# Patient Record
Sex: Female | Born: 1970 | Race: White | Hispanic: No | Marital: Married | Smoking: Never smoker
Health system: Southern US, Community
[De-identification: ages and names within clinical notes are randomized; demographics above are authoritative.]

## PROBLEM LIST (undated history)

## (undated) DIAGNOSIS — T7840XA Allergy, unspecified, initial encounter: Secondary | ICD-10-CM

## (undated) HISTORY — PX: WISDOM TOOTH EXTRACTION: SHX21

## (undated) HISTORY — DX: Allergy, unspecified, initial encounter: T78.40XA

---

## 2003-04-09 ENCOUNTER — Emergency Department (HOSPITAL_COMMUNITY): Admission: EM | Admit: 2003-04-09 | Discharge: 2003-04-09 | Payer: Self-pay

## 2004-07-19 ENCOUNTER — Ambulatory Visit: Payer: Self-pay | Admitting: Internal Medicine

## 2005-10-16 ENCOUNTER — Ambulatory Visit: Payer: Self-pay | Admitting: Internal Medicine

## 2006-10-30 ENCOUNTER — Ambulatory Visit: Payer: Self-pay | Admitting: Obstetrics and Gynecology

## 2008-09-15 ENCOUNTER — Ambulatory Visit: Payer: Self-pay | Admitting: Internal Medicine

## 2008-09-16 ENCOUNTER — Encounter: Payer: Self-pay | Admitting: Internal Medicine

## 2008-09-19 ENCOUNTER — Encounter (INDEPENDENT_AMBULATORY_CARE_PROVIDER_SITE_OTHER): Payer: Self-pay | Admitting: *Deleted

## 2008-09-26 ENCOUNTER — Encounter (INDEPENDENT_AMBULATORY_CARE_PROVIDER_SITE_OTHER): Payer: Self-pay | Admitting: *Deleted

## 2009-08-12 ENCOUNTER — Ambulatory Visit: Payer: Self-pay | Admitting: Family Medicine

## 2009-08-12 LAB — CONVERTED CEMR LAB
Ketones, urine, test strip: NEGATIVE
Nitrite: NEGATIVE
Urobilinogen, UA: 0.2

## 2009-11-09 ENCOUNTER — Ambulatory Visit: Payer: Self-pay | Admitting: Internal Medicine

## 2010-04-09 ENCOUNTER — Ambulatory Visit: Payer: Self-pay | Admitting: Internal Medicine

## 2010-04-17 ENCOUNTER — Encounter: Payer: Self-pay | Admitting: Internal Medicine

## 2010-10-07 LAB — CONVERTED CEMR LAB
AST: 23 units/L (ref 0–37)
Alkaline Phosphatase: 40 units/L (ref 39–117)
Basophils Absolute: 0 10*3/uL (ref 0.0–0.1)
Chloride: 105 meq/L (ref 96–112)
Cholesterol: 194 mg/dL (ref 0–200)
Eosinophils Absolute: 0.1 10*3/uL (ref 0.0–0.7)
GFR calc non Af Amer: 86 mL/min
HDL: 67.4 mg/dL (ref >39.0)
MCHC: 35 g/dL (ref 30.0–36.0)
MCV: 96.1 fL (ref 78.0–100.0)
Neutrophils Relative %: 61.3 % (ref 43.0–77.0)
Platelets: 266 10*3/uL (ref 150–400)
Potassium: 3.7 meq/L (ref 3.5–5.1)
Sodium: 139 meq/L (ref 135–145)
Total Bilirubin: 0.7 mg/dL (ref 0.3–1.2)
VLDL: 14 mg/dL (ref 0–40)
WBC: 7 10*3/uL (ref 4.5–10.5)

## 2010-10-09 NOTE — Assessment & Plan Note (Signed)
Summary: sore throat//fever//lch   Vital Signs:  Patient profile:   40 year old female Weight:      140.2 pounds Temp:     99.4 degrees F oral Pulse rate:   72 / minute Resp:     14 per minute BP sitting:   100 / 62  (left arm) Cuff size:   large  Vitals Entered By: Shonna Chock (November 09, 2009 2:51 PM) CC: Sore Throat and fever x 24 hours. Patient also with headache.  Comments REVIEWED MED LIST, PATIENT AGREED DOSE AND INSTRUCTION CORRECT    CC:  Sore Throat and fever x 24 hours. Patient also with headache. .  History of Present Illness: ST X 24 hrs with temp to 100.6; Rx: NSAIDS with benefit. No nasal symptoms , myalgias or arthralgias. Flu shot 06/2009.  Allergies (verified): No Known Drug Allergies  Review of Systems General:  Complains of chills and fever; denies sweats. Eyes:  Denies discharge and eye pain. Nole:  Denies nasal congestion, postnasal drainage, and sinus pressure; Frontal headache; no facial pain or purulence. Resp:  Complains of cough; denies shortness of breath, sputum productive, and wheezing.  Physical Exam  General:  Appears tired but in no acute distress; alert,appropriate and cooperative throughout examination Eyes:  No corneal or conjunctival inflammation noted. Perrla.  Ears:  External ear exam shows no significant lesions or deformities.  Otoscopic examination reveals clear canals, tympanic membranes are intact bilaterally without bulging, retraction, inflammation or discharge. Hearing is grossly normal bilaterally. Nose:  External nasal examination shows no deformity or inflammation. Nasal mucosa are pink and moist without lesions or exudates. Mouth:  Oral mucosa and oropharynx without lesions or exudates.  Teeth in good repair. Minimal pharyngeal erythema.   Lungs:  Normal respiratory effort, chest expands symmetrically. Lungs are clear to auscultation, no crackles or wheezes. Heart:  Normal rate and regular rhythm. S1 and S2 normal without  gallop, murmur, click, rub or other extra sounds. Cervical Nodes:  No lymphadenopathy noted Axillary Nodes:  No palpable lymphadenopathy   Impression & Recommendations:  Problem # 1:  PHARYNGITIS-ACUTE (ICD-462)  Her updated medication list for this problem includes:    Azithromycin 250 Mg Tabs (Azithromycin) .Marland Kitchen... As per pack  Problem # 2:  COUGH (ICD-786.2)  Complete Medication List: 1)  Low-ogestrel 0.3-30 Mg-mcg Tabs (Norgestrel-ethinyl estradiol) .... As directed 2)  Restasis 0.05 % Emul (Cyclosporine) .Marland Kitchen.. 1 drop in each eye two times a day 3)  Azithromycin 250 Mg Tabs (Azithromycin) .... As per pack  Other Orders: Rapid Strep (52841)  Patient Instructions: 1)  Drink as much fluid as you can tolerate for the next few days. 2)  Take 650-1000mg  of Tylenol every 4-6 hours as needed for relief of pain or comfort of fever AVOID taking more than 4000mg   in a 24 hour period (can cause liver damage in higher doses)OR take 400-600mg  of Ibuprofen (Advil, Motrin) with food every 4-6 hours as needed for relief of pain or comfort of fever. Prescriptions: AZITHROMYCIN 250 MG TABS (AZITHROMYCIN) as per pack  #1 x 0   Entered and Authorized by:   Marga Melnick MD   Signed by:   Marga Melnick MD on 11/09/2009   Method used:   Faxed to ...       [facility] [phone]* (retail)       [address]       [place], [place]  [address]  Ph: [phone]       Fax: [phone]   RxID:   [id number]

## 2010-10-09 NOTE — Assessment & Plan Note (Signed)
Summary: CPX AND FASTING LABS///SP[facility]   Vital Signs:  Patient profile:   40 year old female Height:      68 inches Weight:      140.4 pounds BMI:     21.42 Temp:     98.5 degrees F oral Pulse rate:   62 / minute Resp:     14 per minute BP sitting:   100 / 62  (left arm) Cuff size:   large  Vitals Entered By: Shonna Chock CMA (April 09, 2010 9:51 AM)    History of Present Illness: Maria Valenzuela is here for an  physical; she is asymptomatic .  Current Medications (verified): 1)  Low-Ogestrel 0.3-30 Mg-Mcg Tabs (Norgestrel-Ethinyl Estradiol) .... As Directed 2)  Restasis 0.05 % Emul (Cyclosporine) .Marland Kitchen.. 1 Drop in Each Eye Two Times A Day  Allergies (verified): No Known Drug Allergies  Past History:  Past Medical History: Calcium  oxalate crystals in urine, PMH of ( no PMH  of  renal calculi)  Past Surgical History: Denies surgical history except Wisdom Teeth extraction unilaterally  Family History: P aunt : stomach cancer Father: DVT of UE, alcoholism Mother: MI @  60 Siblings: negative; PGF: lung  cancer; CAD in both sides of family; PGM :DM;P aunt: MI in her 75s  Social History: Occupation: Optician, dispensing Married Never Smoked Alcohol use-yes: socially Regular exercise-yes: 2-3X/week medium - high intensity  Review of Systems  The patient denies anorexia, fever, weight loss, weight gain, vision loss, decreased hearing, hoarseness, chest pain, syncope, dyspnea on exertion, peripheral edema, prolonged cough, headaches, hemoptysis, abdominal pain, melena, hematochezia, severe indigestion/heartburn, hematuria, suspicious skin lesions, depression, unusual weight change, abnormal bleeding, enlarged lymph nodes, and angioedema.         Lightheadedness with pulse > 160  @  high level of exercise   Physical Exam  General:  Thin but well-nourished; alert,appropriate and cooperative throughout examination Head:  Normocephalic and atraumatic without obvious abnormalities.  Eyes:   No corneal or conjunctival inflammation noted.Perrla. Funduscopic exam benign, without hemorrhages, exudates or papilledema.  Ears:  External ear exam shows no significant lesions or deformities.  Otoscopic examination reveals clear canals, tympanic membranes are intact bilaterally without bulging, retraction, inflammation or discharge. Hearing is grossly normal bilaterally. Nose:  External nasal examination shows no deformity or inflammation. Nasal mucosa are pink and moist without lesions or exudates. Mouth:  Oral mucosa and oropharynx without lesions or exudates.  Teeth in good repair. Neck:  No deformities, masses, or tenderness noted. thyroid small Lungs:  Normal respiratory effort, chest expands symmetrically. Lungs are clear to auscultation, no crackles or wheezes. Heart:  Normal rate and regular rhythm. S1 and S2 normal without gallop, murmur, click, rub. Soft S4 Abdomen:  Bowel sounds positive,abdomen soft and non-tender without masses, organomegaly or hernias noted. Msk:  No deformity or scoliosis noted of thoracic or lumbar spine.   Pulses:  R and L carotid,radial,dorsalis pedis and posterior tibial pulses are full and equal bilaterally Extremities:  No clubbing, cyanosis, edema, or deformity noted with normal full range of motion of all joints.   Neurologic:  alert & oriented X3 and DTRs symmetrical and normal.   Skin:  Intact without suspicious lesions or rashes Cervical Nodes:  No lymphadenopathy noted Axillary Nodes:  No palpable lymphadenopathy Psych:  memory intact for recent and remote, normally interactive, and good eye contact.     Impression & Recommendations:  Problem # 1:  ROUTINE GENERAL MEDICAL EXAM@HEALTH  CARE FACL (ICD-V70.0)  Orders:  EKG w/ Interpretation (93000)  Complete Medication List: 1)  Low-ogestrel 0.3-30 Mg-mcg Tabs (Norgestrel-ethinyl estradiol) .... As directed 2)  Restasis 0.05 % Emul (Cyclosporine) .Marland Kitchen.. 1 drop in each eye two times a day  Patient  Instructions: 1)  It is important that you exercise regularly at least 20 minutes 5 times a week. Recommended is  an optimal program of 30-45 min 3-4X/ week. If you develop chest pain, have severe difficulty breathing, or feel very tired , stop exercising immediately and seek medical attention. Keep maximal heart rate to < 130.Consider fasting labs: 2)  BMP ; 3)  Hepatic Panel ; 4)  Lipid Panel ; 5)  TSH ; 6)  CBC w/ Diff .

## 2012-01-29 ENCOUNTER — Ambulatory Visit: Payer: Self-pay | Admitting: Obstetrics and Gynecology

## 2013-03-03 ENCOUNTER — Ambulatory Visit: Payer: Self-pay | Admitting: Obstetrics and Gynecology

## 2014-05-02 LAB — HM PAP SMEAR

## 2014-06-02 ENCOUNTER — Ambulatory Visit: Payer: Self-pay | Admitting: Obstetrics and Gynecology

## 2015-05-18 ENCOUNTER — Other Ambulatory Visit: Payer: Self-pay | Admitting: Obstetrics and Gynecology

## 2015-05-18 DIAGNOSIS — Z1231 Encounter for screening mammogram for malignant neoplasm of breast: Secondary | ICD-10-CM

## 2015-05-18 LAB — FECAL OCCULT BLOOD, GUAIAC: Fecal Occult Blood: NEGATIVE

## 2015-06-06 ENCOUNTER — Ambulatory Visit: Payer: Self-pay

## 2015-06-14 ENCOUNTER — Ambulatory Visit
Admission: RE | Admit: 2015-06-14 | Discharge: 2015-06-14 | Disposition: A | Discharge status: Home / Self Care | Payer: 59 | Source: Ambulatory Visit | Attending: Obstetrics and Gynecology | Admitting: Obstetrics and Gynecology

## 2015-06-14 DIAGNOSIS — Z1231 Encounter for screening mammogram for malignant neoplasm of breast: Secondary | ICD-10-CM | POA: Insufficient documentation

## 2015-06-14 LAB — HM MAMMOGRAPHY

## 2016-01-04 ENCOUNTER — Encounter: Payer: Self-pay | Admitting: Internal Medicine

## 2016-01-04 ENCOUNTER — Ambulatory Visit (INDEPENDENT_AMBULATORY_CARE_PROVIDER_SITE_OTHER): Payer: 59 | Admitting: Internal Medicine

## 2016-01-04 VITALS — BP 98/62 | HR 64 | Temp 98.4°F | Ht 67.0 in | Wt 138.0 lb

## 2016-01-04 DIAGNOSIS — Z0000 Encounter for general adult medical examination without abnormal findings: Secondary | ICD-10-CM | POA: Diagnosis not present

## 2016-01-04 DIAGNOSIS — J302 Other seasonal allergic rhinitis: Secondary | ICD-10-CM | POA: Diagnosis not present

## 2016-01-04 NOTE — Patient Instructions (Signed)
Health Maintenance, Female Adopting a healthy lifestyle and getting preventive care can go a long way to promote health and wellness. Talk with your health care provider about what schedule of regular examinations is right for you. This is a good chance for you to check in with your provider about disease prevention and staying healthy. In between checkups, there are plenty of things you can do on your own. Experts have done a lot of research about which lifestyle changes and preventive measures are most likely to keep you healthy. Ask your health care provider for more information. WEIGHT AND DIET  Eat a healthy diet  Be sure to include plenty of vegetables, fruits, low-fat dairy products, and lean protein.  Do not eat a lot of foods high in solid fats, added sugars, or salt.  Get regular exercise. This is one of the most important things you can do for your health.  Most adults should exercise for at least 150 minutes each week. The exercise should increase your heart rate and make you sweat (moderate-intensity exercise).  Most adults should also do strengthening exercises at least twice a week. This is in addition to the moderate-intensity exercise.  Maintain a healthy weight  Body mass index (BMI) is a measurement that can be used to identify possible weight problems. It estimates body fat based on height and weight. Your health care provider can help determine your BMI and help you achieve or maintain a healthy weight.  For females 20 years of age and older:   A BMI below 18.5 is considered underweight.  A BMI of 18.5 to 24.9 is normal.  A BMI of 25 to 29.9 is considered overweight.  A BMI of 30 and above is considered obese.  Watch levels of cholesterol and blood lipids  You should start having your blood tested for lipids and cholesterol at 45 years of age, then have this test every 5 years.  You may need to have your cholesterol levels checked more often if:  Your lipid  or cholesterol levels are high.  You are older than 45 years of age.  You are at high risk for heart disease.  CANCER SCREENING   Lung Cancer  Lung cancer screening is recommended for adults 55-80 years old who are at high risk for lung cancer because of a history of smoking.  A yearly low-dose CT scan of the lungs is recommended for people who:  Currently smoke.  Have quit within the past 15 years.  Have at least a 30-pack-year history of smoking. A pack year is smoking an average of one pack of cigarettes a day for 1 year.  Yearly screening should continue until it has been 15 years since you quit.  Yearly screening should stop if you develop a health problem that would prevent you from having lung cancer treatment.  Breast Cancer  Practice breast self-awareness. This means understanding how your breasts normally appear and feel.  It also means doing regular breast self-exams. Let your health care provider know about any changes, no matter how small.  If you are in your 20s or 30s, you should have a clinical breast exam (CBE) by a health care provider every 1-3 years as part of a regular health exam.  If you are 40 or older, have a CBE every year. Also consider having a breast X-ray (mammogram) every year.  If you have a family history of breast cancer, talk to your health care provider about genetic screening.  If you   are at high risk for breast cancer, talk to your health care provider about having an MRI and a mammogram every year.  Breast cancer gene (BRCA) assessment is recommended for women who have family members with BRCA-related cancers. BRCA-related cancers include:  Breast.  Ovarian.  Tubal.  Peritoneal cancers.  Results of the assessment will determine the need for genetic counseling and BRCA1 and BRCA2 testing. Cervical Cancer Your health care provider may recommend that you be screened regularly for cancer of the pelvic organs (ovaries, uterus, and  vagina). This screening involves a pelvic examination, including checking for microscopic changes to the surface of your cervix (Pap test). You may be encouraged to have this screening done every 3 years, beginning at age 21.  For women ages 30-65, health care providers may recommend pelvic exams and Pap testing every 3 years, or they may recommend the Pap and pelvic exam, combined with testing for human papilloma virus (HPV), every 5 years. Some types of HPV increase your risk of cervical cancer. Testing for HPV may also be done on women of any age with unclear Pap test results.  Other health care providers may not recommend any screening for nonpregnant women who are considered low risk for pelvic cancer and who do not have symptoms. Ask your health care provider if a screening pelvic exam is right for you.  If you have had past treatment for cervical cancer or a condition that could lead to cancer, you need Pap tests and screening for cancer for at least 20 years after your treatment. If Pap tests have been discontinued, your risk factors (such as having a new sexual partner) need to be reassessed to determine if screening should resume. Some women have medical problems that increase the chance of getting cervical cancer. In these cases, your health care provider may recommend more frequent screening and Pap tests. Colorectal Cancer  This type of cancer can be detected and often prevented.  Routine colorectal cancer screening usually begins at 45 years of age and continues through 45 years of age.  Your health care provider may recommend screening at an earlier age if you have risk factors for colon cancer.  Your health care provider may also recommend using home test kits to check for hidden blood in the stool.  A small camera at the end of a tube can be used to examine your colon directly (sigmoidoscopy or colonoscopy). This is done to check for the earliest forms of colorectal  cancer.  Routine screening usually begins at age 50.  Direct examination of the colon should be repeated every 5-10 years through 45 years of age. However, you may need to be screened more often if early forms of precancerous polyps or small growths are found. Skin Cancer  Check your skin from head to toe regularly.  Tell your health care provider about any new moles or changes in moles, especially if there is a change in a mole's shape or color.  Also tell your health care provider if you have a mole that is larger than the size of a pencil eraser.  Always use sunscreen. Apply sunscreen liberally and repeatedly throughout the day.  Protect yourself by wearing long sleeves, pants, a wide-brimmed hat, and sunglasses whenever you are outside. HEART DISEASE, DIABETES, AND HIGH BLOOD PRESSURE   High blood pressure causes heart disease and increases the risk of stroke. High blood pressure is more likely to develop in:  People who have blood pressure in the high end   of the normal range (130-139/85-89 mm Hg).  People who are overweight or obese.  People who are African American.  If you are 38-23 years of age, have your blood pressure checked every 3-5 years. If you are 61 years of age or older, have your blood pressure checked every year. You should have your blood pressure measured twice--once when you are at a hospital or clinic, and once when you are not at a hospital or clinic. Record the average of the two measurements. To check your blood pressure when you are not at a hospital or clinic, you can use:  An automated blood pressure machine at a pharmacy.  A home blood pressure monitor.  If you are between 45 years and 39 years old, ask your health care provider if you should take aspirin to prevent strokes.  Have regular diabetes screenings. This involves taking a blood sample to check your fasting blood sugar level.  If you are at a normal weight and have a low risk for diabetes,  have this test once every three years after 45 years of age.  If you are overweight and have a high risk for diabetes, consider being tested at a younger age or more often. PREVENTING INFECTION  Hepatitis B  If you have a higher risk for hepatitis B, you should be screened for this virus. You are considered at high risk for hepatitis B if:  You were born in a country where hepatitis B is common. Ask your health care provider which countries are considered high risk.  Your parents were born in a high-risk country, and you have not been immunized against hepatitis B (hepatitis B vaccine).  You have HIV or AIDS.  You use needles to inject street drugs.  You live with someone who has hepatitis B.  You have had sex with someone who has hepatitis B.  You get hemodialysis treatment.  You take certain medicines for conditions, including cancer, organ transplantation, and autoimmune conditions. Hepatitis C  Blood testing is recommended for:  Everyone born from [birth year] through 1965.  Anyone with known risk factors for hepatitis C. Sexually transmitted infections (STIs)  You should be screened for sexually transmitted infections (STIs) including gonorrhea and chlamydia if:  You are sexually active and are younger than 45 years of age.  You are older than 45 years of age and your health care provider tells you that you are at risk for this type of infection.  Your sexual activity has changed since you were last screened and you are at an increased risk for chlamydia or gonorrhea. Ask your health care provider if you are at risk.  If you do not have HIV, but are at risk, it may be recommended that you take a prescription medicine daily to prevent HIV infection. This is called pre-exposure prophylaxis (PrEP). You are considered at risk if:  You are sexually active and do not regularly use condoms or know the HIV status of your partner(s).  You take drugs by injection.  You are sexually  active with a partner who has HIV. Talk with your health care provider about whether you are at high risk of being infected with HIV. If you choose to begin PrEP, you should first be tested for HIV. You should then be tested every 3 months for as long as you are taking PrEP.  PREGNANCY   If you are premenopausal and you may become pregnant, ask your health care provider about preconception counseling.  If you may  become pregnant, take 400 to 800 micrograms (mcg) of folic acid every day.  If you want to prevent pregnancy, talk to your health care provider about birth control (contraception). OSTEOPOROSIS AND MENOPAUSE   Osteoporosis is a disease in which the bones lose minerals and strength with aging. This can result in serious bone fractures. Your risk for osteoporosis can be identified using a bone density scan.  If you are 61 years of age or older, or if you are at risk for osteoporosis and fractures, ask your health care provider if you should be screened.  Ask your health care provider whether you should take a calcium or vitamin D supplement to lower your risk for osteoporosis.  Menopause may have certain physical symptoms and risks.  Hormone replacement therapy may reduce some of these symptoms and risks. Talk to your health care provider about whether hormone replacement therapy is right for you.  HOME CARE INSTRUCTIONS   Schedule regular health, dental, and eye exams.  Stay current with your immunizations.   Do not use any tobacco products including cigarettes, chewing tobacco, or electronic cigarettes.  If you are pregnant, do not drink alcohol.  If you are breastfeeding, limit how much and how often you drink alcohol.  Limit alcohol intake to no more than 1 drink per day for nonpregnant women. One drink equals 12 ounces of beer, 5 ounces of wine, or 1 ounces of hard liquor.  Do not use street drugs.  Do not share needles.  Ask your health care provider for help if  you need support or information about quitting drugs.  Tell your health care provider if you often feel depressed.  Tell your health care provider if you have ever been abused or do not feel safe at home.   This information is not intended to replace advice given to you by your health care provider. Make sure you discuss any questions you have with your health care provider.   Document Released: 03/11/2011 Document Revised: 09/16/2014 Document Reviewed: 07/28/2013 Elsevier Interactive Patient Education [employer].

## 2016-01-04 NOTE — Progress Notes (Signed)
HPI  Pt presents to the clinic today to establish care and for management of the conditions listed below. She is transferring care from Dr. Alwyn Ren, but has not seen him in 6 years. She does follow with gyn. She would like to get her annual exam today.  Seasonal Allergies: Worse in the Spring. She takes Flonase OTC with good relief. She reports she occasionally blows clear, blood streaked mucous out of her nose which is a little concerning, but she has not tried anything to treat this.  Flu: 06/2015 Tetanus: < 5 years ago, unsure of exact date Pap Smear: 2015, abnormal per pt Mammogram: 06/2015 at [facility], normal Vision Screening: yearly Dentist: biannually  Diet: She does eat lean meat. She consumes fruits and veggies daily. She tries to avoid fried foods. She drinks mostly water. Exercise: She likes to walk.   Past Medical History  Diagnosis Date  . Allergy     Current Outpatient Prescriptions  Medication Sig Dispense Refill  . CRYSELLE-28 0.3-30 MG-MCG tablet Take 1 tablet by mouth daily.      No current facility-administered medications for this visit.    No Known Allergies  Family History  Problem Relation Age of Onset  . Stomach cancer Paternal Aunt 75  . Diabetes Paternal Aunt   . Lung cancer Paternal Grandfather 45  . Arthritis Mother   . Heart disease Mother   . Alcohol abuse Father   . Heart disease Father     Social History   Social History  . Marital Status: Married    Spouse Name: N/A  . Number of Children: N/A  . Years of Education: N/A   Occupational History  . Not on file.   Social History Main Topics  . Smoking status: Never Smoker   . Smokeless tobacco: Never Used  . Alcohol Use: 0.0 oz/week    0 Standard drinks or equivalent per week     Comment: occasional  . Drug Use: Not on file  . Sexual Activity: Not on file   Other Topics Concern  . Not on file   Social History Narrative    ROS:  Constitutional: Denies fever, malaise,  fatigue, headache or abrupt weight changes.  HEENT: Denies eye pain, eye redness, ear pain, ringing in the ears, wax buildup, runny nose, nasal congestion, or sore throat. Respiratory: Denies difficulty breathing, shortness of breath, cough or sputum production.   Cardiovascular: Denies chest pain, chest tightness, palpitations or swelling in the hands or feet.  Gastrointestinal: Denies abdominal pain, bloating, constipation, diarrhea or blood in the stool.  GU: Denies frequency, urgency, pain with urination, blood in urine, odor or discharge. Musculoskeletal: Denies decrease in range of motion, difficulty with gait, muscle pain or joint pain and swelling.  Skin: Denies redness, rashes, lesions or ulcercations.  Neurological: Denies dizziness, difficulty with memory, difficulty with speech or problems with balance and coordination.  Psych: Denies anxiety, depression, SI/HI.  No other specific complaints in a complete review of systems (except as listed in HPI above).  PE:  BP 98/62 mmHg  Pulse 64  Temp(Src) 98.4 F (36.9 C) (Oral)  Ht  (1.702 m)  Wt 138 lb (62.596 kg)  BMI 21.61 kg/m2  SpO2 99%  LMP 12/08/2015 Wt Readings from Last 3 Encounters:  01/04/16 138 lb (62.596 kg)  04/09/10 140 lb 6.4 oz (63.685 kg)  11/09/09 140 lb 3.2 oz (63.594 kg)    General: Appears her stated age, well developed, well nourished in NAD. HEENT: Head: normal  shape and size; Eyes: sclera white, no icterus, conjunctiva pink, PERRLA and EOMs intact; Ears: Tm's gray and intact, normal light reflex; Throat/Mouth: Teeth present, mucosa pink and moist, no lesions or ulcerations noted.  Neck: Neck supple, trachea midline. No masses, lumps or thyromegaly present.  Cardiovascular: Normal rate and rhythm. S1,S2 noted.  No murmur, rubs or gallops noted. No JVD or BLE edema.  Pulmonary/Chest: Normal effort and positive vesicular breath sounds. No respiratory distress. No wheezes, rales or ronchi noted.   Abdomen: Soft and nontender. Normal bowel sounds. No distention or masses noted. Liver, spleen and kidneys non palpable. Musculoskeletal: Strength 5/5 BUE/BLE. No signs of joint swelling. No difficulty with gait.  Neurological: Alert and oriented. Cranial nerves II-XII grossly intact. Coordination normal.  Psychiatric: Mood and affect normal. Behavior is normal. Judgment and thought content normal.    BMET    Component Value Date/Time   NA 139 09/15/2008 0000   K 3.7 09/15/2008 0000   CL 105 09/15/2008 0000   CO2 26 09/15/2008 0000   GLUCOSE 77 09/15/2008 0000   BUN 9 09/15/2008 0000   CREATININE 0.8 09/15/2008 0000   CALCIUM 9.1 09/15/2008 0000   GFRNONAA 86 09/15/2008 0000   GFRAA 104 09/15/2008 0000    Lipid Panel     Component Value Date/Time   CHOL 194 09/15/2008 0000   TRIG 68 09/15/2008 0000   HDL 67.4 09/15/2008 0000   CHOLHDL 2.9 CALC 09/15/2008 0000   VLDL 14 09/15/2008 0000   LDLCALC 113* 09/15/2008 0000    CBC    Component Value Date/Time   WBC 7.0 09/15/2008 0000   RBC 3.84* 09/15/2008 0000   HGB 12.9 09/15/2008 0000   HCT 36.9 09/15/2008 0000   PLT 266 09/15/2008 0000   MCV 96.1 09/15/2008 0000   MCHC 35.0 09/15/2008 0000   RDW 12.1 09/15/2008 0000   MONOABS 0.6 09/15/2008 0000   EOSABS 0.1 09/15/2008 0000   BASOSABS 0.0 09/15/2008 0000    Hgb A1C No results found for: HGBA1C   Assessment and Plan:  Preventative Health Maintenance:  Flu and Tetanus UTD [facility], she will continue to follow with gyn Mammogram UTD Encouraged her to consume a balanced diet and continue exercise Encouraged her to see an eye doctor and dentist at least annually CBC, CMET, Lipid and HIV today  RTC in 1 year or sooner if needed

## 2016-01-04 NOTE — Progress Notes (Signed)
Pre visit review using our clinic review tool, if applicable. No additional management support is needed unless otherwise documented below in the visit note. 

## 2016-01-04 NOTE — Assessment & Plan Note (Signed)
Continue Flonase daily prn If noticing blood tinged mucous, add in Nasal Saline spray daily prn

## 2016-01-05 LAB — COMPREHENSIVE METABOLIC PANEL
ALBUMIN: 4.4 g/dL (ref 3.5–5.5)
ALK PHOS: 50 IU/L (ref 39–117)
ALT: 16 IU/L (ref 0–32)
AST: 25 IU/L (ref 0–40)
Albumin/Globulin Ratio: 1.4 (ref 1.2–2.2)
BUN / CREAT RATIO: 12 (ref 9–23)
BUN: 12 mg/dL (ref 6–24)
Bilirubin Total: 0.2 mg/dL (ref 0.0–1.2)
CO2: 19 mmol/L (ref 18–29)
CREATININE: 1.01 mg/dL — AB (ref 0.57–1.00)
Calcium: 9.7 mg/dL (ref 8.7–10.2)
Chloride: 101 mmol/L (ref 96–106)
GFR calc Af Amer: 78 mL/min/{1.73_m2} (ref >59)
GFR calc non Af Amer: 68 mL/min/{1.73_m2} (ref >59)
Globulin, Total: 3.2 g/dL (ref 1.5–4.5)
Glucose: 90 mg/dL (ref 65–99)
Potassium: 4.5 mmol/L (ref 3.5–5.2)
Sodium: 141 mmol/L (ref 134–144)
Total Protein: 7.6 g/dL (ref 6.0–8.5)

## 2016-01-05 LAB — LIPID PANEL
CHOLESTEROL TOTAL: 244 mg/dL — AB (ref 100–199)
Chol/HDL Ratio: 3.1 ratio units (ref 0.0–4.4)
HDL: 78 mg/dL (ref >39)
LDL CALC: 146 mg/dL — AB (ref 0–99)
TRIGLYCERIDES: 100 mg/dL (ref 0–149)
VLDL CHOLESTEROL CAL: 20 mg/dL (ref 5–40)

## 2016-01-05 LAB — HIV ANTIBODY (ROUTINE TESTING W REFLEX): HIV Screen 4th Generation wRfx: NONREACTIVE

## 2016-01-05 LAB — CBC
HEMOGLOBIN: 13.4 g/dL (ref 11.1–15.9)
Hematocrit: 39.3 % (ref 34.0–46.6)
MCH: 32.1 pg (ref 26.6–33.0)
MCHC: 34.1 g/dL (ref 31.5–35.7)
MCV: 94 fL (ref 79–97)
Platelets: 285 10*3/uL (ref 150–379)
RBC: 4.17 x10E6/uL (ref 3.77–5.28)
RDW: 13.2 % (ref 12.3–15.4)
WBC: 6.4 10*3/uL (ref 3.4–10.8)

## 2016-01-30 ENCOUNTER — Encounter: Payer: Self-pay | Admitting: Internal Medicine

## 2016-02-26 ENCOUNTER — Encounter: Payer: Self-pay | Admitting: Internal Medicine

## 2016-02-26 ENCOUNTER — Ambulatory Visit (INDEPENDENT_AMBULATORY_CARE_PROVIDER_SITE_OTHER): Payer: 59 | Admitting: Internal Medicine

## 2016-02-26 VITALS — BP 100/70 | HR 66 | Temp 98.9°F | Wt 138.5 lb

## 2016-02-26 DIAGNOSIS — L237 Allergic contact dermatitis due to plants, except food: Secondary | ICD-10-CM | POA: Diagnosis not present

## 2016-02-26 MED ORDER — TRIAMCINOLONE ACETONIDE 0.1 % EX CREA: 1.0000 "application " | TOPICAL_CREAM | Freq: Two times a day (BID) | CUTANEOUS | Status: AC

## 2016-02-26 NOTE — Progress Notes (Signed)
Pre visit review using our clinic review tool, if applicable. No additional management support is needed unless otherwise documented below in the visit note. 

## 2016-02-26 NOTE — Patient Instructions (Signed)

## 2016-02-26 NOTE — Progress Notes (Signed)
Subjective:    Patient ID: GRAHAMHeather D Valenzuela, female    DOB: 04/14/1971, 45 y.o.   MRN: [id number]  HPI  Pt presents to the clinic today with c/o a rash. She noticed this 1 week ago. The rash is on both of her arms, abdomen and right leg. The rash is very itchy. She was out mowing grass just prior to the rash coming on. She has tried Hydrocortisone cream without any relief. She denies changes in soaps, lotions or detergents.  Review of Systems      Past Medical History  Diagnosis Date  . Allergy     Current Outpatient Prescriptions  Medication Sig Dispense Refill  . CRYSELLE-28 0.3-30 MG-MCG tablet Take 1 tablet by mouth daily.      No current facility-administered medications for this visit.    No Known Allergies  Family History  Problem Relation Age of Onset  . Stomach cancer Paternal Aunt 4050  . Diabetes Paternal Aunt   . Cancer Paternal Aunt     stomach  . Lung cancer Paternal Grandfather 3060  . Cancer Paternal Grandfather     lung  . Arthritis Mother   . Heart disease Mother   . Alcohol abuse Father   . Heart disease Father     Social History   Social History  . Marital Status: Married    Spouse Name: N/A  . Number of Children: N/A  . Years of Education: N/A   Occupational History  . Not on file.   Social History Main Topics  . Smoking status: Never Smoker   . Smokeless tobacco: Never Used  . Alcohol Use: 0.0 oz/week    0 Standard drinks or equivalent per week     Comment: occasional  . Drug Use: No  . Sexual Activity: No   Other Topics Concern  . Not on file   Social History Narrative     Constitutional: Denies fever, malaise, fatigue, headache or abrupt weight changes.  Skin: Pt reports rash. Denies ulcercations.    No other specific complaints in a complete review of systems (except as listed in HPI above).  Objective:   Physical Exam   BP 100/70 mmHg  Pulse 66  Temp(Src) 98.9 F (37.2 C) (Oral)  Wt 138 lb 8 oz (62.823 kg)  SpO2  99%  LMP 02/04/2016 Wt Readings from Last 3 Encounters:  02/26/16 138 lb 8 oz (62.823 kg)  01/04/16 138 lb (62.596 kg)  04/09/10 140 lb 6.4 oz (63.685 kg)    General: Appears her stated age, well developed, well nourished in NAD. Skin: Scattered vesicular lesions noted on bilateral arms, abdomen and right leg.  BMET    Component Value Date/Time   NA 141 01/04/2016 0953   NA 139 09/15/2008 0000   K 4.5 01/04/2016 0953   CL 101 01/04/2016 0953   CO2 19 01/04/2016 0953   GLUCOSE 90 01/04/2016 0953   GLUCOSE 77 09/15/2008 0000   BUN 12 01/04/2016 0953   BUN 9 09/15/2008 0000   CREATININE 1.01* 01/04/2016 0953   CALCIUM 9.7 01/04/2016 0953   GFRNONAA 68 01/04/2016 0953   GFRAA 78 01/04/2016 0953    Lipid Panel     Component Value Date/Time   CHOL 244* 01/04/2016 0953   CHOL 194 09/15/2008 0000   TRIG 100 01/04/2016 0953   HDL 78 01/04/2016 0953   HDL 67.4 09/15/2008 0000   CHOLHDL 3.1 01/04/2016 0953   CHOLHDL 2.9 CALC 09/15/2008 0000   VLDL 14  09/15/2008 0000   LDLCALC 146* 01/04/2016 0953   LDLCALC 113* 09/15/2008 0000    CBC    Component Value Date/Time   WBC 6.4 01/04/2016 0953   WBC 7.0 09/15/2008 0000   RBC 4.17 01/04/2016 0953   RBC 3.84* 09/15/2008 0000   HGB 12.9 09/15/2008 0000   HCT 39.3 01/04/2016 0953   HCT 36.9 09/15/2008 0000   PLT 285 01/04/2016 0953   PLT 266 09/15/2008 0000   MCV 94 01/04/2016 0953   MCV 96.1 09/15/2008 0000   MCH 32.1 01/04/2016 0953   MCHC 34.1 01/04/2016 0953   MCHC 35.0 09/15/2008 0000   RDW 13.2 01/04/2016 0953   RDW 12.1 09/15/2008 0000   MONOABS 0.6 09/15/2008 0000   EOSABS 0.1 09/15/2008 0000   BASOSABS 0.0 09/15/2008 0000    Hgb A1C No results found for: HGBA1C      Assessment & Plan:   Poison Ivy:  80 mg Depo IM today eRx for Kenalog cream BID Benadryl as needed for itching  RTC as needed  Nicki Reaper, NP

## 2016-02-27 MED ORDER — METHYLPREDNISOLONE ACETATE 80 MG/ML IJ SUSP
80.0000 mg | Freq: Once | INTRAMUSCULAR | Status: AC
  Administered 2016-02-26: 80 mg via INTRAMUSCULAR

## 2016-02-27 NOTE — Addendum Note (Signed)
Addended by: Roena MaladyEVONTENNO, Breyanna Valera Y on: 02/27/2016 12:04 PM   Modules accepted: Orders

## 2016-06-20 ENCOUNTER — Other Ambulatory Visit: Payer: Self-pay | Admitting: Obstetrics and Gynecology

## 2016-06-20 DIAGNOSIS — Z1239 Encounter for other screening for malignant neoplasm of breast: Secondary | ICD-10-CM

## 2016-07-24 ENCOUNTER — Ambulatory Visit
Admission: RE | Admit: 2016-07-24 | Discharge: 2016-07-24 | Disposition: A | Discharge status: Home / Self Care | Payer: 59 | Source: Ambulatory Visit | Attending: Obstetrics and Gynecology | Admitting: Obstetrics and Gynecology

## 2016-07-24 DIAGNOSIS — Z1239 Encounter for other screening for malignant neoplasm of breast: Secondary | ICD-10-CM

## 2016-07-24 DIAGNOSIS — Z1231 Encounter for screening mammogram for malignant neoplasm of breast: Secondary | ICD-10-CM | POA: Insufficient documentation

## 2017-07-07 ENCOUNTER — Other Ambulatory Visit: Payer: Self-pay | Admitting: Obstetrics and Gynecology

## 2017-07-07 DIAGNOSIS — Z1231 Encounter for screening mammogram for malignant neoplasm of breast: Secondary | ICD-10-CM

## 2017-08-21 ENCOUNTER — Ambulatory Visit
Admission: RE | Admit: 2017-08-21 | Discharge: 2017-08-21 | Disposition: A | Discharge status: Home / Self Care | Payer: 59 | Source: Ambulatory Visit | Attending: Obstetrics and Gynecology | Admitting: Obstetrics and Gynecology

## 2017-08-21 DIAGNOSIS — Z1231 Encounter for screening mammogram for malignant neoplasm of breast: Secondary | ICD-10-CM | POA: Diagnosis not present

## 2018-07-09 ENCOUNTER — Other Ambulatory Visit: Payer: Self-pay | Admitting: Obstetrics and Gynecology

## 2018-07-09 DIAGNOSIS — Z1231 Encounter for screening mammogram for malignant neoplasm of breast: Secondary | ICD-10-CM

## 2018-08-25 ENCOUNTER — Ambulatory Visit
Admission: RE | Admit: 2018-08-25 | Discharge: 2018-08-25 | Disposition: A | Discharge status: Home / Self Care | Payer: BLUE CROSS/BLUE SHIELD | Source: Ambulatory Visit | Attending: Obstetrics and Gynecology | Admitting: Obstetrics and Gynecology

## 2018-08-25 DIAGNOSIS — Z1231 Encounter for screening mammogram for malignant neoplasm of breast: Secondary | ICD-10-CM | POA: Diagnosis present

## 2019-08-11 ENCOUNTER — Other Ambulatory Visit: Payer: Self-pay | Admitting: Obstetrics and Gynecology

## 2019-08-11 DIAGNOSIS — Z1231 Encounter for screening mammogram for malignant neoplasm of breast: Secondary | ICD-10-CM

## 2019-08-31 ENCOUNTER — Ambulatory Visit
Admission: RE | Admit: 2019-08-31 | Discharge: 2019-08-31 | Disposition: A | Discharge status: Home / Self Care | Payer: BC Managed Care – PPO | Source: Ambulatory Visit | Attending: Obstetrics and Gynecology | Admitting: Obstetrics and Gynecology

## 2019-08-31 DIAGNOSIS — Z1231 Encounter for screening mammogram for malignant neoplasm of breast: Secondary | ICD-10-CM

## 2019-12-03 ENCOUNTER — Ambulatory Visit: Payer: Self-pay

## 2020-10-18 ENCOUNTER — Other Ambulatory Visit: Payer: Self-pay | Admitting: Obstetrics and Gynecology

## 2020-10-18 DIAGNOSIS — Z1231 Encounter for screening mammogram for malignant neoplasm of breast: Secondary | ICD-10-CM

## 2020-11-13 ENCOUNTER — Ambulatory Visit
Admission: RE | Admit: 2020-11-13 | Discharge: 2020-11-13 | Disposition: A | Discharge status: Home / Self Care | Payer: BC Managed Care – PPO | Source: Ambulatory Visit | Attending: Obstetrics and Gynecology | Admitting: Obstetrics and Gynecology

## 2020-11-13 ENCOUNTER — Other Ambulatory Visit: Payer: Self-pay

## 2020-11-13 DIAGNOSIS — Z1231 Encounter for screening mammogram for malignant neoplasm of breast: Secondary | ICD-10-CM | POA: Insufficient documentation

## 2021-09-25 ENCOUNTER — Other Ambulatory Visit: Payer: Self-pay | Admitting: Obstetrics and Gynecology

## 2021-09-25 DIAGNOSIS — Z1231 Encounter for screening mammogram for malignant neoplasm of breast: Secondary | ICD-10-CM

## 2021-11-14 ENCOUNTER — Other Ambulatory Visit: Payer: Self-pay

## 2021-11-14 ENCOUNTER — Ambulatory Visit
Admission: RE | Admit: 2021-11-14 | Discharge: 2021-11-14 | Disposition: A | Discharge status: Home / Self Care | Payer: BC Managed Care – PPO | Source: Ambulatory Visit | Attending: Obstetrics and Gynecology | Admitting: Obstetrics and Gynecology

## 2021-11-14 DIAGNOSIS — Z1231 Encounter for screening mammogram for malignant neoplasm of breast: Secondary | ICD-10-CM | POA: Insufficient documentation

## 2022-06-06 LAB — EXTERNAL GENERIC LAB PROCEDURE: COLOGUARD: NEGATIVE

## 2022-10-07 ENCOUNTER — Other Ambulatory Visit: Payer: Self-pay | Admitting: Obstetrics and Gynecology

## 2022-10-07 DIAGNOSIS — Z1231 Encounter for screening mammogram for malignant neoplasm of breast: Secondary | ICD-10-CM

## 2022-11-18 ENCOUNTER — Ambulatory Visit
Admission: RE | Admit: 2022-11-18 | Discharge: 2022-11-18 | Disposition: A | Discharge status: Home / Self Care | Payer: BC Managed Care – PPO | Source: Ambulatory Visit | Attending: Obstetrics and Gynecology | Admitting: Obstetrics and Gynecology

## 2022-11-18 DIAGNOSIS — Z1231 Encounter for screening mammogram for malignant neoplasm of breast: Secondary | ICD-10-CM

## 2023-06-02 ENCOUNTER — Other Ambulatory Visit: Payer: Self-pay | Admitting: Internal Medicine

## 2023-06-02 DIAGNOSIS — Z8249 Family history of ischemic heart disease and other diseases of the circulatory system: Secondary | ICD-10-CM

## 2023-06-04 ENCOUNTER — Inpatient Hospital Stay: Admission: RE | Admit: 2023-06-04 | Payer: BC Managed Care – PPO | Source: Ambulatory Visit

## 2023-06-11 ENCOUNTER — Ambulatory Visit
Admission: RE | Admit: 2023-06-11 | Discharge: 2023-06-11 | Disposition: A | Discharge status: Home / Self Care | Payer: BC Managed Care – PPO | Source: Ambulatory Visit | Attending: Internal Medicine | Admitting: Internal Medicine

## 2023-06-11 DIAGNOSIS — Z8249 Family history of ischemic heart disease and other diseases of the circulatory system: Secondary | ICD-10-CM | POA: Insufficient documentation

## 2023-12-03 ENCOUNTER — Other Ambulatory Visit: Payer: Self-pay | Admitting: Obstetrics and Gynecology

## 2023-12-03 DIAGNOSIS — Z1231 Encounter for screening mammogram for malignant neoplasm of breast: Secondary | ICD-10-CM

## 2023-12-16 ENCOUNTER — Ambulatory Visit
Admission: RE | Admit: 2023-12-16 | Discharge: 2023-12-16 | Disposition: A | Discharge status: Home / Self Care | Source: Ambulatory Visit | Attending: Obstetrics and Gynecology | Admitting: Obstetrics and Gynecology

## 2023-12-16 DIAGNOSIS — Z1231 Encounter for screening mammogram for malignant neoplasm of breast: Secondary | ICD-10-CM | POA: Insufficient documentation

## 2023-12-31 IMAGING — MG MM DIGITAL SCREENING BILAT W/ TOMO AND CAD
6 of 12 series · 6 of 36 positions shown · non-contrast
Comparison: Previous exam(s).

CLINICAL DATA: Screening.

EXAM:
DIGITAL SCREENING BILATERAL MAMMOGRAM WITH TOMOSYNTHESIS AND CAD
TECHNIQUE: Bilateral screening digital craniocaudal and mediolateral oblique
mammograms were obtained. Bilateral screening digital breast
tomosynthesis was performed. The images were evaluated with
computer-aided detection.

[R MLO synth-2D (1 of 2)]
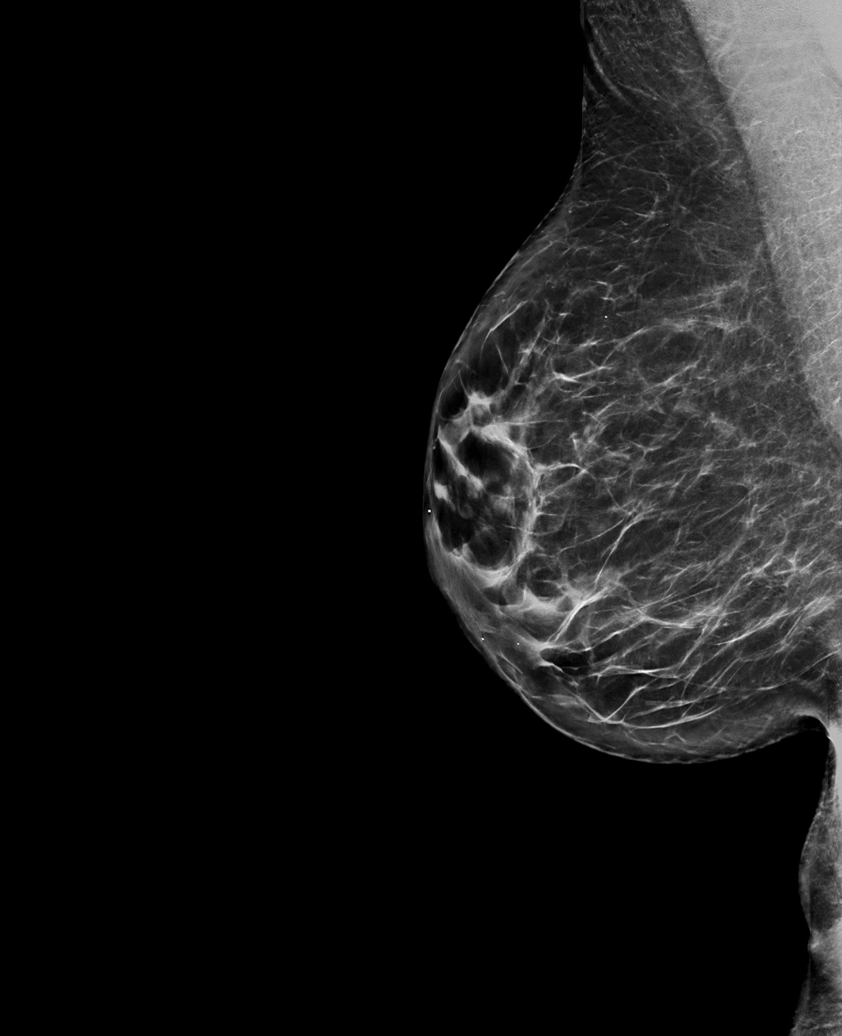

[R MLO synth-2D (2 of 2)]
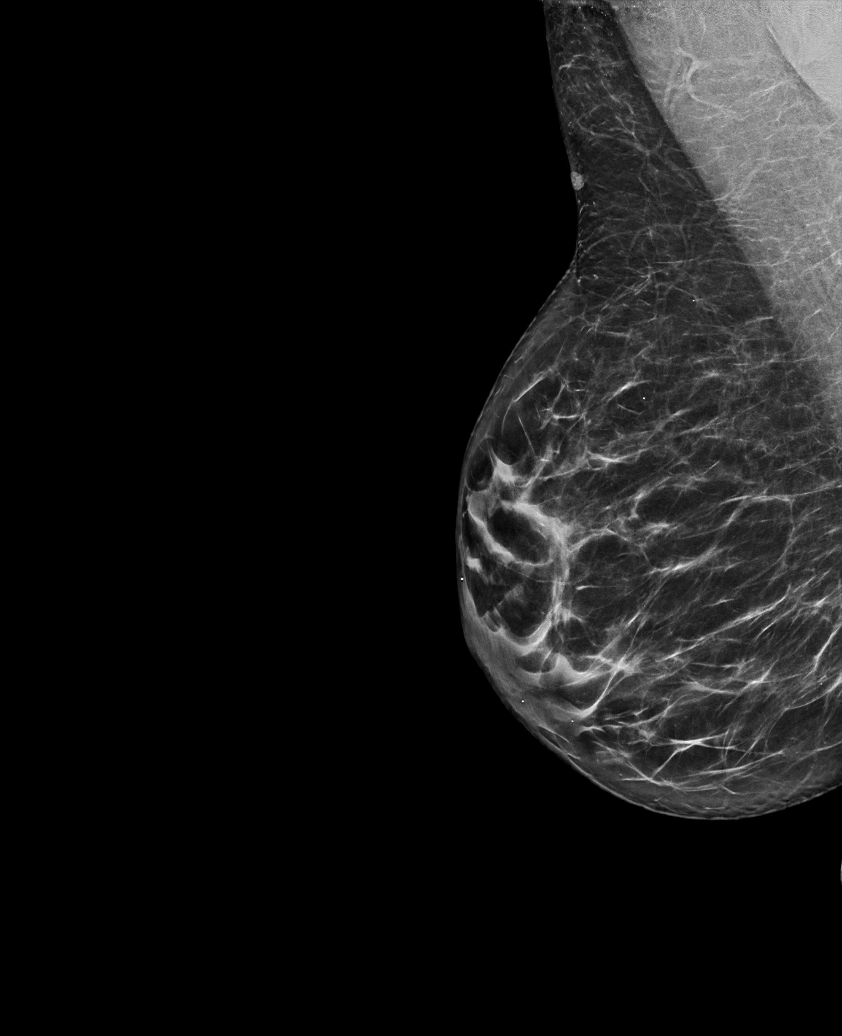

[L CC synth-2D]
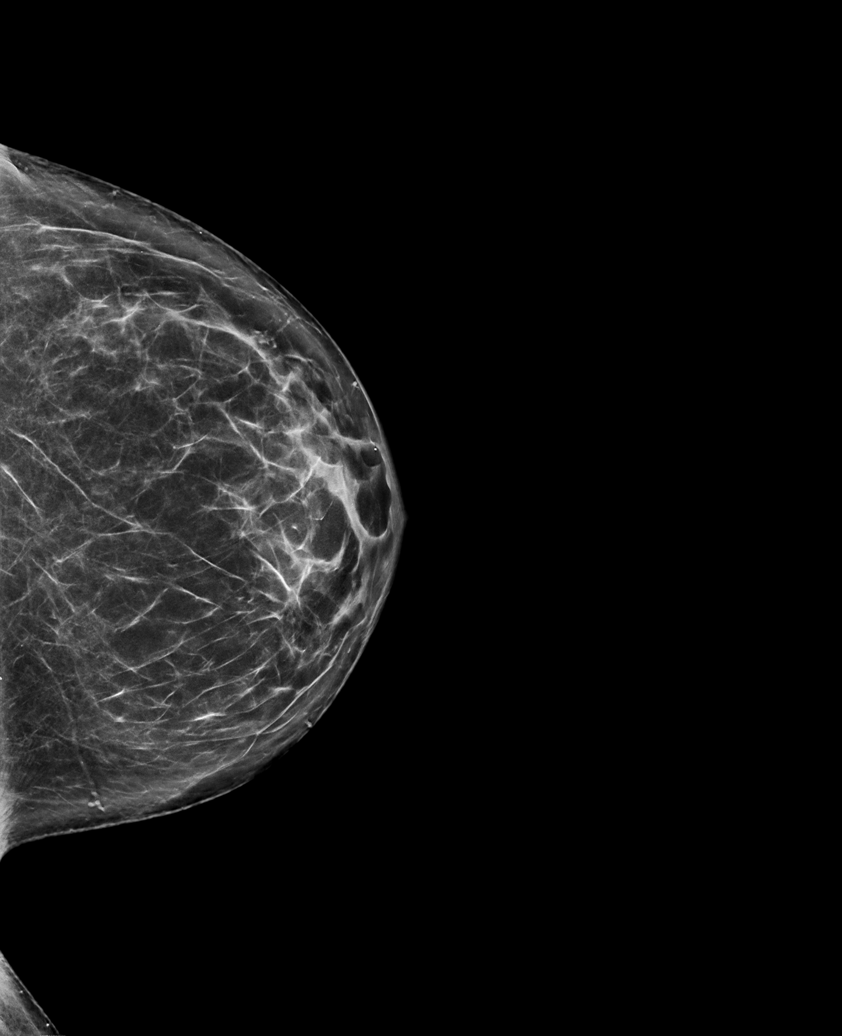

[L MLO synth-2D (1 of 2)]
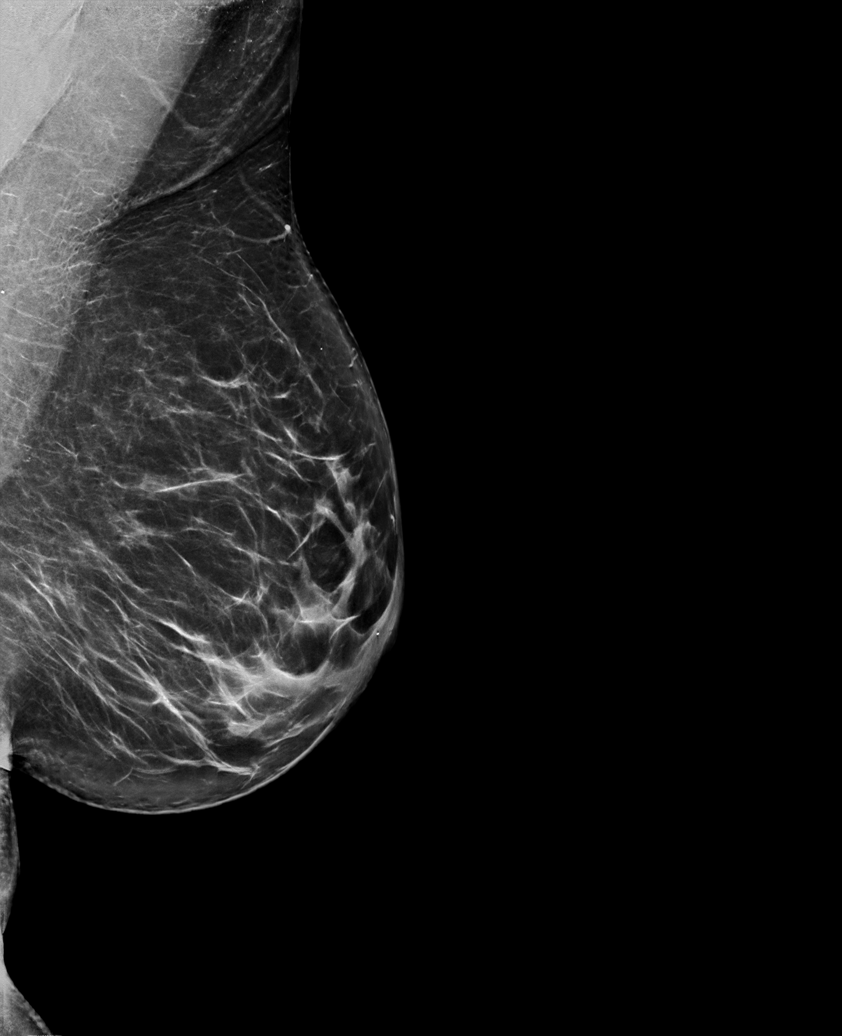

[R CC synth-2D]
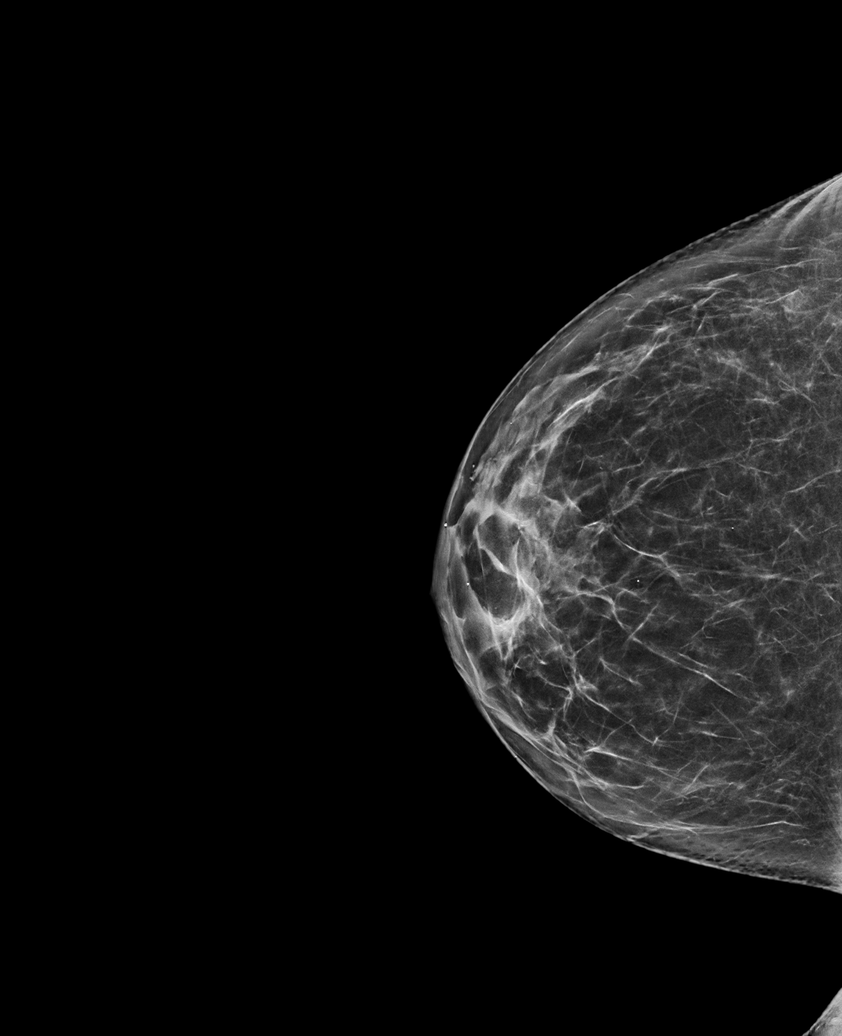

[L MLO synth-2D (2 of 2)]
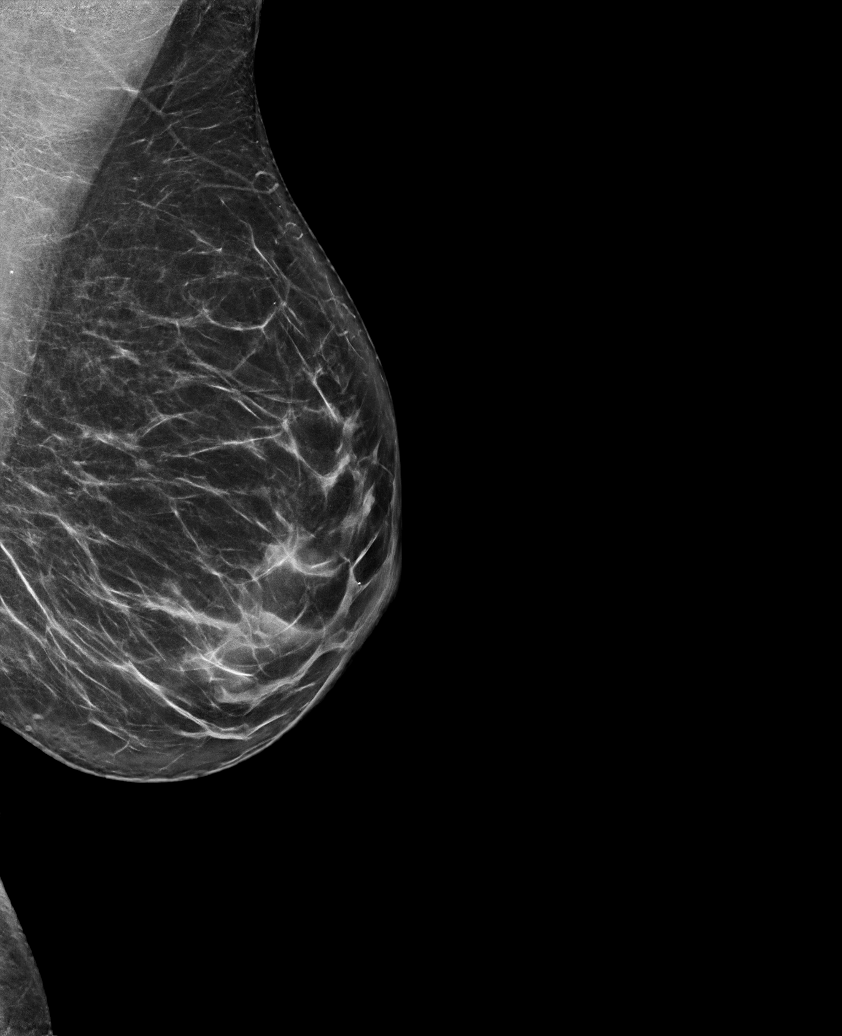

[6 of 36 positions shown; findings below may reference images not displayed]

ACR Breast Density Category b: There are scattered areas of
fibroglandular density.
FINDINGS: There are no findings suspicious for malignancy.
IMPRESSION: No mammographic evidence of malignancy. A result letter of this
screening mammogram will be mailed directly to the patient.

RECOMMENDATION:
Screening mammogram in one year. (Code:51-O-LD2)

BI-RADS CATEGORY  1: Negative.
